# Patient Record
Sex: Female | Born: 1980 | Hispanic: Yes | Marital: Married | Smoking: Never smoker
Health system: Southern US, Community
[De-identification: ages and names within clinical notes are randomized; demographics above are authoritative.]

---

## 2019-10-27 ENCOUNTER — Emergency Department (HOSPITAL_COMMUNITY)
Admission: EM | Admit: 2019-10-27 | Discharge: 2019-10-27 | Disposition: A | Discharge status: Home / Self Care | Payer: Self-pay | Attending: Emergency Medicine | Admitting: Emergency Medicine

## 2019-10-27 ENCOUNTER — Encounter (HOSPITAL_COMMUNITY): Payer: Self-pay | Admitting: Emergency Medicine

## 2019-10-27 ENCOUNTER — Emergency Department (HOSPITAL_COMMUNITY): Payer: Self-pay

## 2019-10-27 ENCOUNTER — Other Ambulatory Visit: Payer: Self-pay

## 2019-10-27 DIAGNOSIS — O209 Hemorrhage in early pregnancy, unspecified: Secondary | ICD-10-CM | POA: Insufficient documentation

## 2019-10-27 DIAGNOSIS — Z3A01 Less than 8 weeks gestation of pregnancy: Secondary | ICD-10-CM | POA: Insufficient documentation

## 2019-10-27 LAB — CBC
HCT: 30.7 % — ABNORMAL LOW (ref 36.0–46.0)
Hemoglobin: 9 g/dL — ABNORMAL LOW (ref 12.0–15.0)
MCH: 19.8 pg — ABNORMAL LOW (ref 26.0–34.0)
MCHC: 29.3 g/dL — ABNORMAL LOW (ref 30.0–36.0)
MCV: 67.5 fL — ABNORMAL LOW (ref 80.0–100.0)
Platelets: 291 10*3/uL (ref 150–400)
RBC: 4.55 MIL/uL (ref 3.87–5.11)
RDW: 19.9 % — ABNORMAL HIGH (ref 11.5–15.5)
WBC: 6 10*3/uL (ref 4.0–10.5)
nRBC: 0 % (ref 0.0–0.2)

## 2019-10-27 LAB — URINALYSIS, ROUTINE W REFLEX MICROSCOPIC
Bilirubin Urine: NEGATIVE
Glucose, UA: NEGATIVE mg/dL
Hgb urine dipstick: NEGATIVE
Ketones, ur: NEGATIVE mg/dL
Leukocytes,Ua: NEGATIVE
Nitrite: NEGATIVE
Protein, ur: NEGATIVE mg/dL
Specific Gravity, Urine: 1.019 (ref 1.005–1.030)
pH: 7 (ref 5.0–8.0)

## 2019-10-27 LAB — COMPREHENSIVE METABOLIC PANEL
ALT: 26 U/L (ref 0–44)
AST: 28 U/L (ref 15–41)
Albumin: 4 g/dL (ref 3.5–5.0)
Alkaline Phosphatase: 62 U/L (ref 38–126)
Anion gap: 10 (ref 5–15)
BUN: 10 mg/dL (ref 6–20)
CO2: 23 mmol/L (ref 22–32)
Calcium: 9.1 mg/dL (ref 8.9–10.3)
Chloride: 104 mmol/L (ref 98–111)
Creatinine, Ser: 0.46 mg/dL (ref 0.44–1.00)
GFR calc Af Amer: >60 mL/min (ref >60)
GFR calc non Af Amer: >60 mL/min (ref >60)
Glucose, Bld: 83 mg/dL (ref 70–99)
Potassium: 3.9 mmol/L (ref 3.5–5.1)
Sodium: 137 mmol/L (ref 135–145)
Total Bilirubin: 0.5 mg/dL (ref 0.3–1.2)
Total Protein: 7.9 g/dL (ref 6.5–8.1)

## 2019-10-27 LAB — HCG, QUANTITATIVE, PREGNANCY: hCG, Beta Chain, Quant, S: 160480 m[IU]/mL — ABNORMAL HIGH (ref <5)

## 2019-10-27 NOTE — ED Notes (Signed)
Pt was informed that we need a urine sample. 

## 2019-10-27 NOTE — Discharge Instructions (Addendum)
Follow-up with your OB/GYN for your appointment on 11/01/2019, as scheduled.  Your hemoglobin was mildly low at 9.0 so I encourage you to discuss with them whether they feel you would benefit from iron supplements.  Otherwise, the remainder of your work-up was all very reassuring.  Today's ultrasound confirmed single live intrauterine gestation at 8 weeks 2 days.  Please return to the ED or seek immediate medical attention should you experience any new or worsening symptoms.  Haga un seguimiento con su obstetra / gineclogo para su cita el 31/10/2019, segn lo programado. Su hemoglobina fue levemente baja a 9.0, por lo que le animo a discutir con ellos si creen que usted se beneficiara de los suplementos de hierro.  De lo contrario, el resto de su trabajo fue muy reconfortante. La ecografa de hoy confirm la gestacin intrauterina viva nica a las 8 semanas y 2 [address]  [address].  Regrese al servicio de urgencias o busque atencin mdica inmediata si experimenta algn sntoma nuevo o que empeora.

## 2019-10-27 NOTE — ED Provider Notes (Addendum)
[facility] EMERGENCY DEPARTMENT Provider Note   CSN: [id number] Arrival date & time: 10/27/19  1135     History Chief Complaint  Patient presents with  . Abdominal Cramping    lower    Jill Trevino is a 39 y.o. female G2P1 who presents to the ED 7w gestation with 1 week history of low back pain and vaginal bleeding.  She also endorses a 4-week history of suprapubic abdominal cramping, but consistent with her previous pregnancy.  She is accompanied by her son who she delivered via C-section.  No other pregnancy complications.  She denies any fevers or chills, chest pain or difficulty breathing, diminished appetite, nausea or vomiting, urinary symptoms, or changes in bowel habits.  Patient reports that she has her OB/GYN appointment scheduled with [facility] on Wednesday, 11/01/2019.  She denies any significant past medical history and is only been taking Tylenol and her prenatal vitamins regularly.  Given her discomfort and bleeding, she wanted to check on the welfare of her baby.  Utilized Transport planner during entirety of exam.  HPI     History reviewed. No pertinent past medical history.  There are no problems to display for this patient.   OB History    Gravida  1   Para      Term      Preterm      AB      Living        SAB      TAB      Ectopic      Multiple      Live Births              No family history on file.  Social History   Tobacco Use  . Smoking status: Never Smoker  Substance Use Topics  . Alcohol use: Never  . Drug use: Never    Home Medications Prior to Admission medications   Medication Sig Start Date End Date Taking? Authorizing Provider  Prenatal Vit-Fe Fumarate-FA (M-NATAL PLUS) 27-1 MG TABS Take 1 tablet by mouth daily. 10/23/19  Yes [provider]    Allergies    Patient has no known allergies.  Review of Systems   Review of Systems  Constitutional: Negative for chills and fever.  Respiratory:  Negative for shortness of breath.   Cardiovascular: Negative for leg swelling.  Gastrointestinal: Positive for abdominal pain. Negative for vomiting.  Genitourinary: Positive for vaginal bleeding.      Physical Exam Updated Vital Signs BP 104/76 (BP Location: Right Arm)   Pulse 79   Temp 98.3 F (36.8 C) (Oral)   Resp 14   Ht 5\' 1"  (1.549 m)   Wt 57.2 kg   LMP 09/06/2019   SpO2 100%   BMI 23.81 kg/m   Physical Exam Vitals and nursing note reviewed. Exam conducted with a chaperone present.  Constitutional:      General: She is not in acute distress.    Appearance: Normal appearance.  HENT:     Head: Normocephalic and atraumatic.  Eyes:     General: No scleral icterus.    Conjunctiva/sclera: Conjunctivae normal.  Cardiovascular:     Rate and Rhythm: Normal rate and regular rhythm.     Pulses: Normal pulses.     Heart sounds: Normal heart sounds.  Pulmonary:     Effort: Pulmonary effort is normal. No respiratory distress.     Breath sounds: Normal breath sounds.  Abdominal:     Comments: Soft, nondistended.  Mild TTP in suprapubic region.  No TTP elsewhere.  No overlying skin changes.  Musculoskeletal:     Comments: Mild midline lumbar TTP.  No redness, swelling, or warmth appreciated.  Skin:    General: Skin is dry.     Capillary Refill: Capillary refill takes less than 2 seconds.  Neurological:     Mental Status: She is alert and oriented to person, place, and time.     GCS: GCS eye subscore is 4. GCS verbal subscore is 5. GCS motor subscore is 6.  Psychiatric:        Mood and Affect: Mood normal.        Behavior: Behavior normal.        Thought Content: Thought content normal.     ED Results / Procedures / Treatments   Labs (all labs ordered are listed, but only abnormal results are displayed) Labs Reviewed  URINALYSIS, ROUTINE W REFLEX MICROSCOPIC - Abnormal; Notable for the following components:      Result Value   APPearance HAZY (*)    All other  components within normal limits  CBC - Abnormal; Notable for the following components:   Hemoglobin 9.0 (*)    HCT 30.7 (*)    MCV 67.5 (*)    MCH 19.8 (*)    MCHC 29.3 (*)    RDW 19.9 (*)    All other components within normal limits  COMPREHENSIVE METABOLIC PANEL  HCG, QUANTITATIVE, PREGNANCY    EKG None  Radiology US OB Comp Less 14 Wks  Result Date: 10/27/2019 CLINICAL DATA:  First trimester pregnancy, LMP 09/06/2019, pain and bleeding/spotting question spontaneous abortion EXAM: OBSTETRIC <14 WK ULTRASOUND TECHNIQUE: Transabdominal ultrasound was performed for evaluation of the gestation as well as the maternal uterus and adnexal regions. COMPARISON:  None FINDINGS: Intrauterine gestational sac: Present, single Yolk sac:  Present Embryo:  Present Cardiac Activity: Present Heart Rate: 152 bpm CRL:   18 mm   8 w 2 d                  [place] EDC: 06/05/2020 Subchorionic hemorrhage:  None visualized. Maternal uterus/adnexae: Simple cyst within RIGHT ovary 3.0 x 2.7 x 3.0 cm question corpus luteal cyst. Ovaries otherwise unremarkable. No free pelvic fluid or adnexal masses. IMPRESSION: Single live intrauterine gestation at 8 weeks 2 days EGA by crown-rump length. No acute abnormalities. Electronically Signed   By: Ulyses Southward M.D.   On: 10/27/2019 13:34    Procedures Procedures (including critical care time)  Medications Ordered in ED Medications - No data to display  ED Course  I have reviewed the triage vital signs and the nursing notes.  Pertinent labs & imaging results that were available during my care of the patient were reviewed by me and considered in my medical decision making (see chart for details).    MDM Rules/Calculators/A&P                      US OB complete demonstrates single live intrauterine gestation at 8 weeks 2 days.  No acute abnormalities.  UA demonstrated no evidence of infection and the remainder of her lab work was reassuring.  Her hemoglobin was low at 9.0,  but no recent labs with which to compare.  Encouraged her to follow-up with her OB/GYN to determine whether or not iron supplement would be advised.  No concern for an acute bleed and she is hemodynamically stable.  Patient is safe to follow-up with her  OB/GYN for her appointment on 11/01/2019, as scheduled.  Discussed assessment and plan with Dr. Reather Converse who voiced agreement.  Strict ED return precautions discussed with the patient and her son.  All of the evaluation and work-up results were discussed with the patient and any family at bedside. They were provided opportunity to ask any additional questions and have none at this time. They have expressed understanding of verbal discharge instructions as well as return precautions and are agreeable to the plan.    Final Clinical Impression(s) / ED Diagnoses Final diagnoses:  Vaginal bleeding affecting early pregnancy    Rx / DC Orders ED Discharge Orders    None       Corena Herter, PA-C 10/27/19 1453    Corena Herter, PA-C 10/27/19 1513    Elnora Morrison, MD 10/28/19 [phone]

## 2019-10-27 NOTE — ED Triage Notes (Signed)
Back pain for 8 days and lower abdominal pain  for 4 week.  C/o dizziness on Wednesday.  Pt is [redacted] weeks pregnant.  Pt says she did have spotting but it stopped.

## 2021-11-21 IMAGING — US US OB COMP LESS 14 WK
1 series · 14 of 28 positions shown · non-contrast
Comparison: None

CLINICAL DATA: First trimester pregnancy, LMP 09/06/2019, pain and
bleeding/spotting question spontaneous abortion

EXAM:
OBSTETRIC <14 WK ULTRASOUND
TECHNIQUE: Transabdominal ultrasound was performed for evaluation of the
gestation as well as the maternal uterus and adnexal regions.

[Series 1: us pelvis (transabdominal only) · 41 acquisitions, 14 frames shown]
[im 2/41]
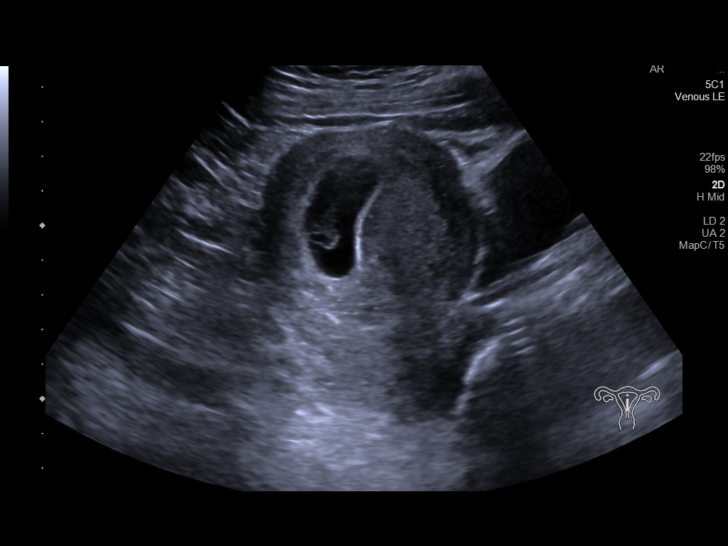
[im 5/41]
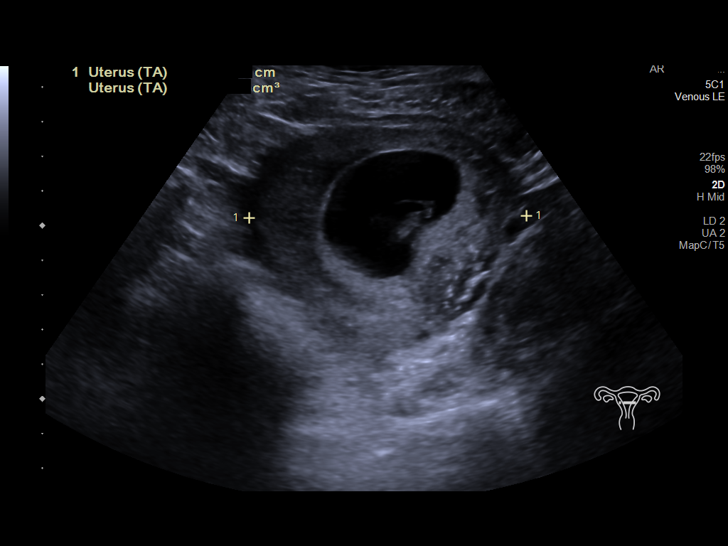
[im 8/41]
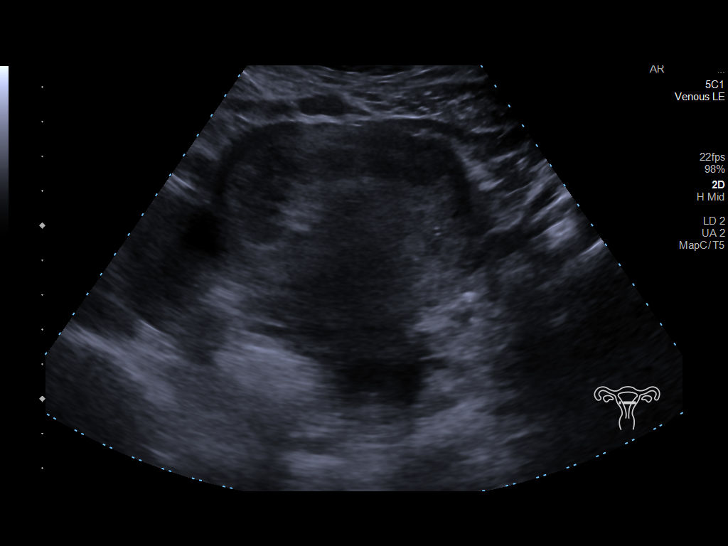
[im 11/41]
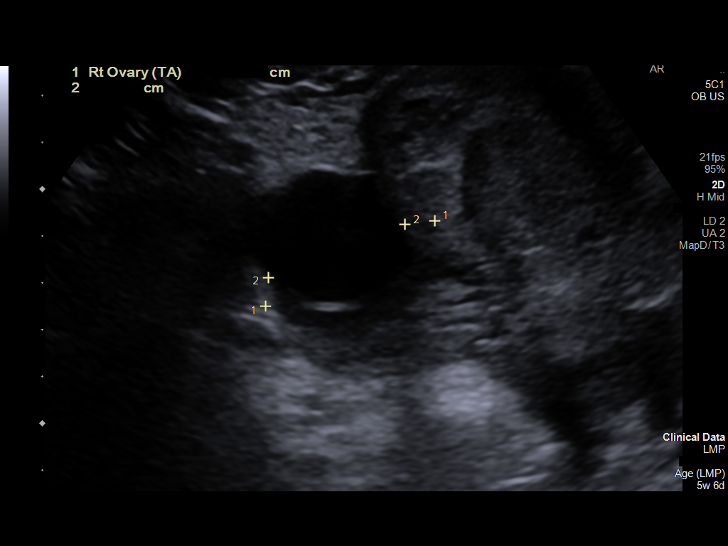
[im 14/41]
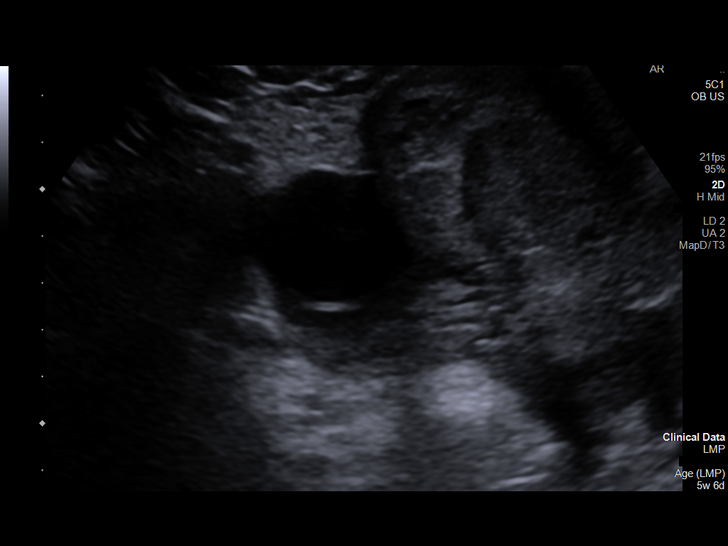
[im 17/41]
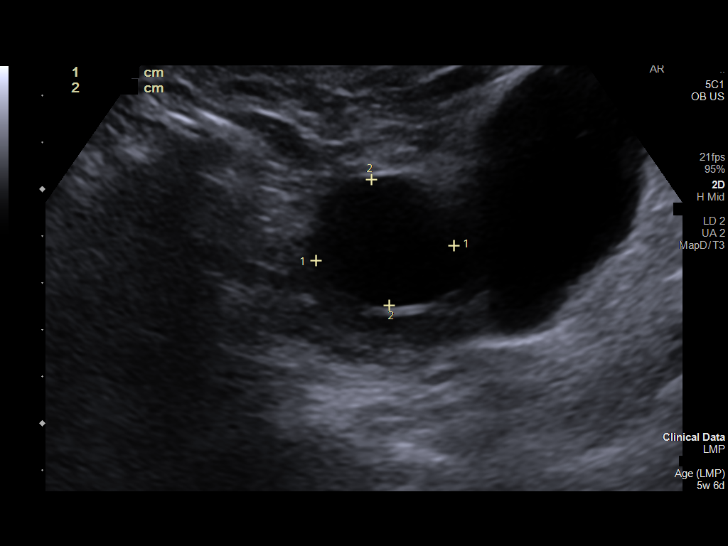
[im 20/41]
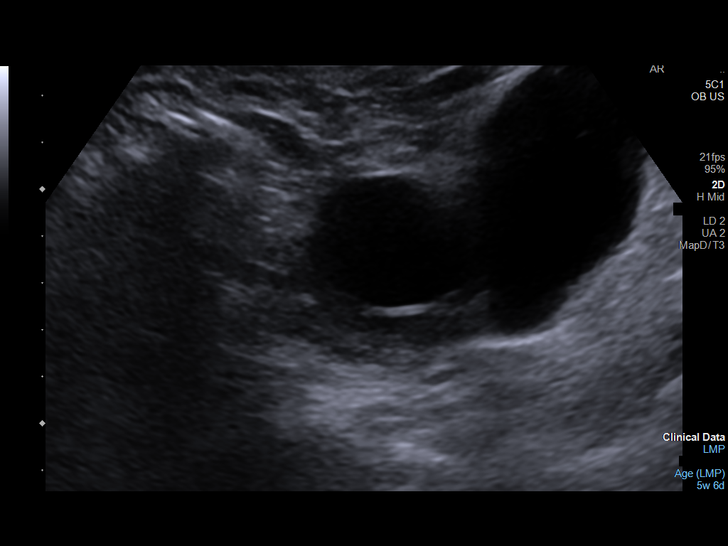
[im 23/41]
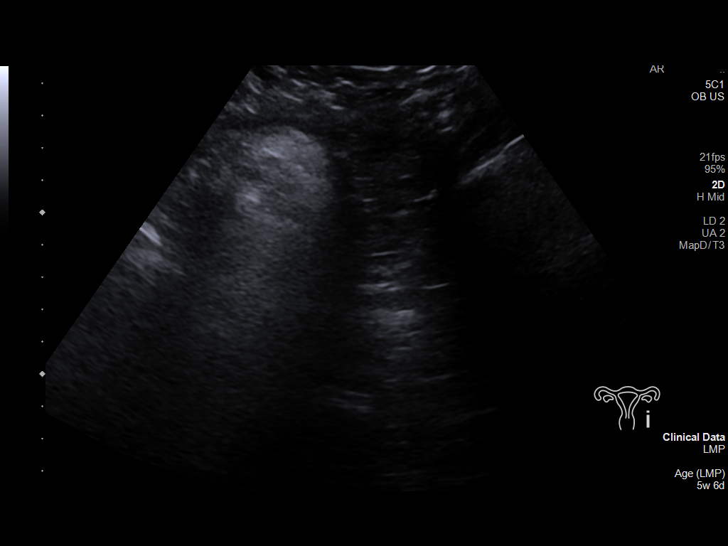
[im 26/41]
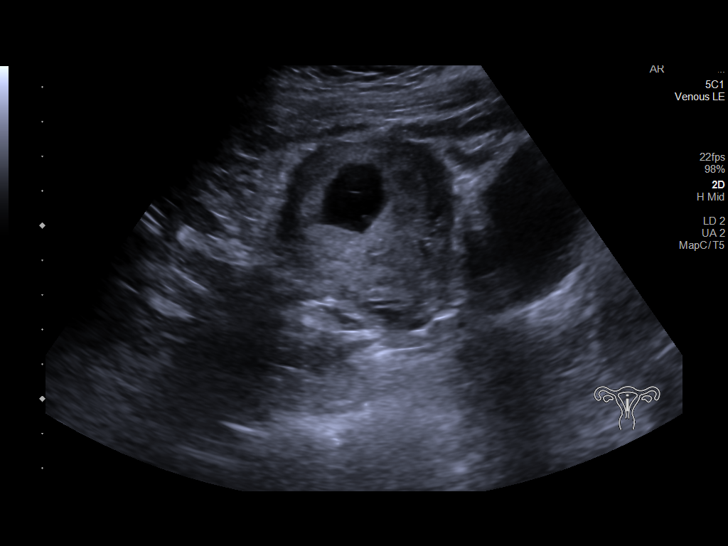
[im 29/41]
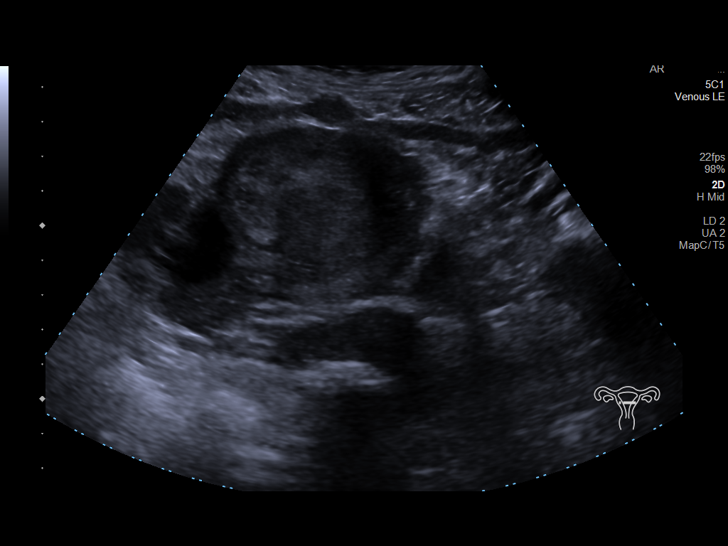
[im 32/41]
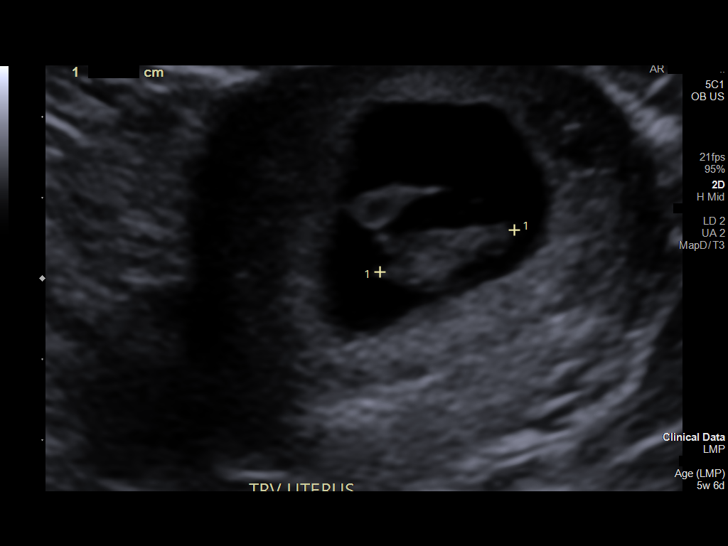
[im 35/41]
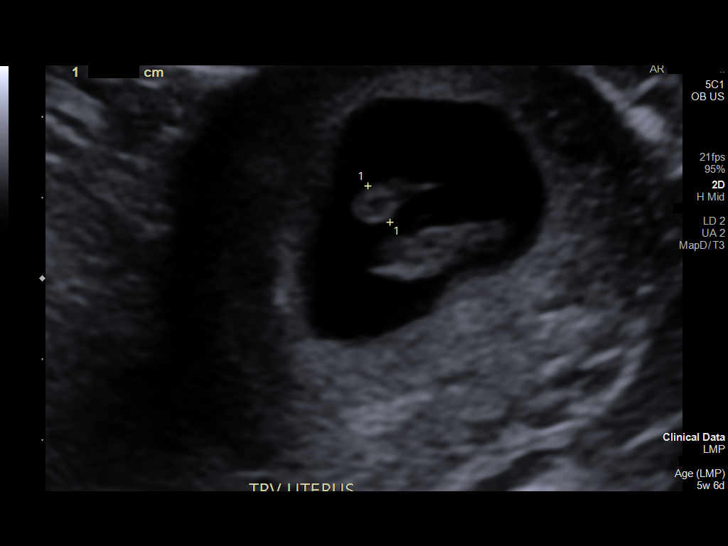
[im 38/41]
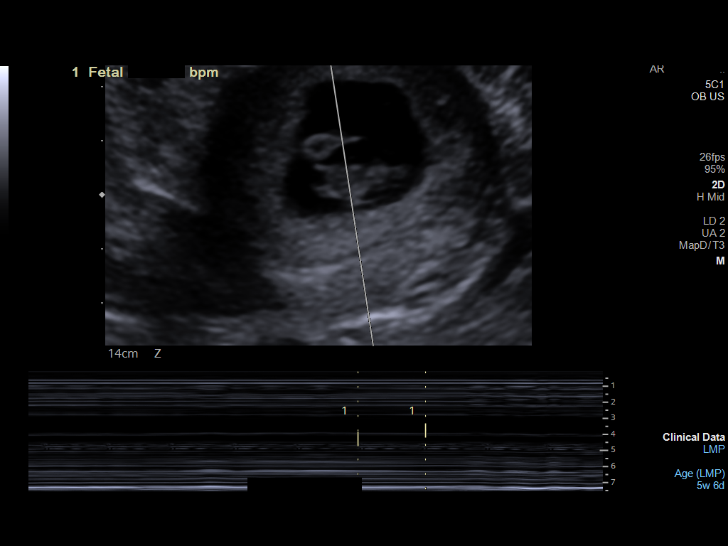
[im 41/41]
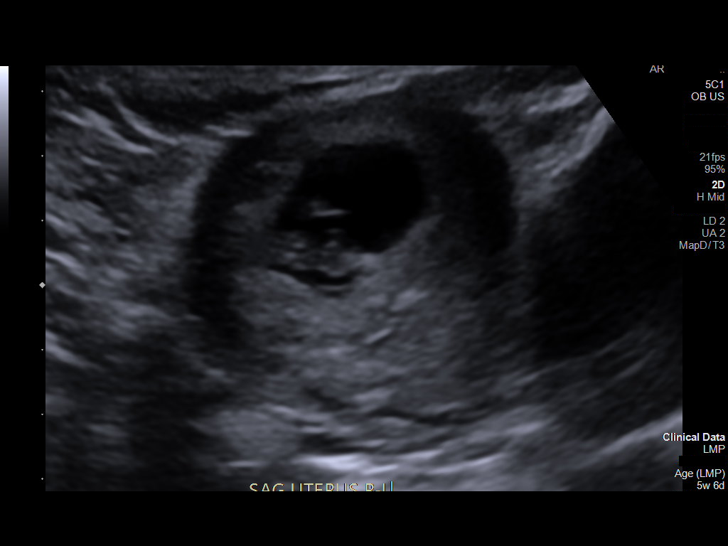

[14 of 28 positions shown; findings below may reference images not displayed]

FINDINGS: Intrauterine gestational sac: Present, single

Yolk sac:  Present

Embryo:  Present

Cardiac Activity: Present

Heart Rate: 152 bpm

CRL:   18 mm   8 w 2 d                  [place] EDC: 06/05/2020

Subchorionic hemorrhage:  None visualized.

Maternal uterus/adnexae:

Simple cyst within RIGHT ovary 3.0 x 2.7 x 3.0 cm question corpus
luteal cyst.

Ovaries otherwise unremarkable.

No free pelvic fluid or adnexal masses.
IMPRESSION: Single live intrauterine gestation at 8 weeks 2 days EGA by
crown-rump length.

No acute abnormalities.
